# Patient Record
Sex: Male | Born: 1960 | Race: White | Hispanic: No | Marital: Married | Smoking: Former smoker
Health system: Southern US, Community
[De-identification: ages and names within clinical notes are randomized; demographics above are authoritative.]

## PROBLEM LIST (undated history)

## (undated) DIAGNOSIS — J309 Allergic rhinitis, unspecified: Secondary | ICD-10-CM

## (undated) DIAGNOSIS — E785 Hyperlipidemia, unspecified: Secondary | ICD-10-CM

## (undated) DIAGNOSIS — K219 Gastro-esophageal reflux disease without esophagitis: Secondary | ICD-10-CM

## (undated) DIAGNOSIS — H9319 Tinnitus, unspecified ear: Secondary | ICD-10-CM

## (undated) DIAGNOSIS — I10 Essential (primary) hypertension: Secondary | ICD-10-CM

## (undated) DIAGNOSIS — F419 Anxiety disorder, unspecified: Secondary | ICD-10-CM

## (undated) HISTORY — PX: TONSILLECTOMY: SUR1361

## (undated) HISTORY — PX: FUNCTIONAL ENDOSCOPIC SINUS SURGERY: SUR616

---

## 2010-02-23 ENCOUNTER — Ambulatory Visit: Payer: Self-pay | Admitting: Gastroenterology

## 2013-01-20 ENCOUNTER — Emergency Department: Payer: Self-pay | Admitting: Emergency Medicine

## 2013-10-08 ENCOUNTER — Ambulatory Visit: Payer: Self-pay | Admitting: Family Medicine

## 2015-07-13 ENCOUNTER — Telehealth: Payer: Self-pay

## 2015-07-13 NOTE — Telephone Encounter (Signed)
  Oncology Nurse Navigator Documentation  Navigator Location: [facility] (07/13/15 1100) Navigator Encounter Type: Telephone (07/13/15 1100)               Barriers/Navigation Needs: Coordination of Care (07/13/15 1100)   Interventions: Coordination of Care (07/13/15 1100)   Coordination of Care: EUS (07/13/15 1100)                  Time Spent with Patient: 30 (07/13/15 1100)   Received referral for EUS for gastric antrum submucosal nodule. Scheduled for 08/06/15 with Dr Kelly Spl[facility]Feiler [facility]. Voicemail left for patient to return call regarding.

## 2015-07-13 NOTE — Telephone Encounter (Signed)
  Oncology Nurse Navigator Documentation  Navigator Location: [facility] (07/13/15 1500) Navigator Encounter Type: Telephone (07/13/15 1500)               Barriers/Navigation Needs: Coordination of Care (07/13/15 1500)   Interventions: Coordination of Care (07/13/15 1500)   Coordination of Care: EUS (07/13/15 1500)                  Time Spent with Patient: 15 (07/13/15 1500)   Spoke with Shawn Blackwell. Eus arranged for 08-06-15. Denies anticoagulants. No history of diabetes. Went over instructions and copy mailed to home address. Contact information included for any further questions or concerns.  INSTRUCTIONS FOR ENDOSCOPIC ULTRASOUND -Your procedure has been scheduled for April 27th with Shawn Blackwell at [facility] -The hospital will contact you to pre-register over the phone. If for any reason you have not received a call within one week prior to your scheduled procedure date, please call [phone]. -To get your scheduled arrival time, please call the Endoscopy unit at  [phone] between 1-3pm on: April 26th     -ON THE DAY OF YOU PROCEDURE:   1. If you are scheduled for a morning procedure, nothing to drink after midnight  -If you are scheduled for an afternoon procedure, you may have clear liquids until 5 hours prior  to the procedure but no carbonated drinks or broth  2. NO FOOD THE DAY OF YOUR PROCEDURE  3. You may take your heart, seizure, blood pressure, Parkinson's or breathing medications at  6am with just enough water to get your pills down  4. Do not take any oral Diabetic medications the morning of your procedure.  5. If you are a diabetic and are using insulin, please notify your prescribing physician of this  procedure as your dose may need to be altered related to not being able to eat or drink.   5. Do not take Vitamins     -On the day of your procedure, come to the [facility] Admitting/Registration desk (First desk on the right) at the scheduled  arrival time. You MUST have someone drive you home from your procedure. You must have a responsible adult with a valid drivers license who is on site throughout your entire procedure and who can stay with you for several hours after your procedure. You may not go home alone in a taxi, shuttle [place] or bus, as the drivers will not be responsible for you.

## 2015-08-06 ENCOUNTER — Encounter: Payer: Self-pay | Admitting: Anesthesiology

## 2015-08-06 ENCOUNTER — Ambulatory Visit: Payer: BLUE CROSS/BLUE SHIELD | Admitting: Anesthesiology

## 2015-08-06 ENCOUNTER — Ambulatory Visit
Admission: RE | Admit: 2015-08-06 | Discharge: 2015-08-06 | Disposition: A | Discharge status: Home / Self Care | Payer: BLUE CROSS/BLUE SHIELD | Source: Ambulatory Visit | Attending: Gastroenterology | Admitting: Gastroenterology

## 2015-08-06 ENCOUNTER — Encounter
Admission: RE | Disposition: A | Discharge status: Home / Self Care | Payer: Self-pay | Source: Ambulatory Visit | Attending: Gastroenterology

## 2015-08-06 DIAGNOSIS — I10 Essential (primary) hypertension: Secondary | ICD-10-CM | POA: Diagnosis not present

## 2015-08-06 DIAGNOSIS — K319 Disease of stomach and duodenum, unspecified: Secondary | ICD-10-CM | POA: Diagnosis present

## 2015-08-06 DIAGNOSIS — E785 Hyperlipidemia, unspecified: Secondary | ICD-10-CM | POA: Insufficient documentation

## 2015-08-06 DIAGNOSIS — K219 Gastro-esophageal reflux disease without esophagitis: Secondary | ICD-10-CM | POA: Diagnosis not present

## 2015-08-06 DIAGNOSIS — F419 Anxiety disorder, unspecified: Secondary | ICD-10-CM | POA: Insufficient documentation

## 2015-08-06 HISTORY — PX: EUS: SHX5427

## 2015-08-06 HISTORY — DX: Allergic rhinitis, unspecified: J30.9

## 2015-08-06 HISTORY — DX: Anxiety disorder, unspecified: F41.9

## 2015-08-06 HISTORY — DX: Essential (primary) hypertension: I10

## 2015-08-06 HISTORY — DX: Hyperlipidemia, unspecified: E78.5

## 2015-08-06 HISTORY — DX: Tinnitus, unspecified ear: H93.19

## 2015-08-06 HISTORY — DX: Gastro-esophageal reflux disease without esophagitis: K21.9

## 2015-08-06 SURGERY — UPPER ENDOSCOPIC ULTRASOUND (EUS) LINEAR
Anesthesia: General

## 2015-08-06 MED ORDER — PROPOFOL 500 MG/50ML IV EMUL
INTRAVENOUS | Status: DC | PRN
  Administered 2015-08-06: 120 ug/kg/min via INTRAVENOUS

## 2015-08-06 MED ORDER — GLYCOPYRROLATE 0.2 MG/ML IJ SOLN
INTRAMUSCULAR | Status: DC | PRN
  Administered 2015-08-06: 0.1 mg via INTRAVENOUS

## 2015-08-06 MED ORDER — MIDAZOLAM HCL 2 MG/2ML IJ SOLN
INTRAMUSCULAR | Status: DC | PRN
  Administered 2015-08-06: 1 mg via INTRAVENOUS

## 2015-08-06 MED ORDER — LIDOCAINE HCL (CARDIAC) 20 MG/ML IV SOLN
INTRAVENOUS | Status: DC | PRN
  Administered 2015-08-06: 30 mg via INTRAVENOUS

## 2015-08-06 MED ORDER — SODIUM CHLORIDE 0.9 % IV SOLN
INTRAVENOUS | Status: DC
  Administered 2015-08-06: 1000 mL via INTRAVENOUS

## 2015-08-06 MED ORDER — FENTANYL CITRATE (PF) 100 MCG/2ML IJ SOLN
INTRAMUSCULAR | Status: DC | PRN
  Administered 2015-08-06: 50 ug via INTRAVENOUS

## 2015-08-06 NOTE — Anesthesia Preprocedure Evaluation (Signed)
Anesthesia Evaluation  Patient identified by MRN, date of birth, ID band Patient awake    Reviewed: Allergy & Precautions, H&P , NPO status , Patient's Chart, lab work & pertinent test results, reviewed documented beta blocker date and time   History of Anesthesia Complications Negative for: history of anesthetic complications  Airway Mallampati: I  TM Distance: >3 FB Neck ROM: full    Dental no notable dental hx. (+) Implants, Teeth Intact   Pulmonary neg pulmonary ROS,    Pulmonary exam normal breath sounds clear to auscultation       Cardiovascular Exercise Tolerance: Good hypertension, (-) angina(-) CAD, (-) Past MI, (-) Cardiac Stents and (-) CABG Normal cardiovascular exam(-) dysrhythmias (-) Valvular Problems/Murmurs Rhythm:regular Rate:Normal     Neuro/Psych negative neurological ROS  negative psych ROS   GI/Hepatic Neg liver ROS, GERD  ,  Endo/Other  negative endocrine ROS  Renal/GU negative Renal ROS  negative genitourinary   Musculoskeletal   Abdominal   Peds  Hematology negative hematology ROS (+)   Anesthesia Other Findings Past Medical History:   Anxiety                                                      Hypertension                                                 GERD (gastroesophageal reflux disease)                       Tinnitus                                                     Allergic rhinitis                                            Hyperlipemia                                                 Reproductive/Obstetrics negative OB ROS                             Anesthesia Physical Anesthesia Plan  ASA: II  Anesthesia Plan: General   Post-op Pain Management:    Induction:   Airway Management Planned:   Additional Equipment:   Intra-op Plan:   Post-operative Plan:   Informed Consent: I have reviewed the patients History and Physical, chart, labs  and discussed the procedure including the risks, benefits and alternatives for the proposed anesthesia with the patient or authorized representative who has indicated his/her understanding and acceptance.   Dental Advisory Given  Plan Discussed with: Anesthesiologist, CRNA and Surgeon  Anesthesia Plan Comments:         Anesthesia Quick  Evaluation

## 2015-08-06 NOTE — Op Note (Signed)
[facility] Gastroenterology Patient Name: Shawn Blackwell Procedure Date: 08/06/2015 2:00 PM MRN: [id number] Account #: [id number] Date of Birth: Dec 06, 1960 Admit Type: Outpatient Age: 55 Room: [facility] ENDO ROOM 3 Gender: Male Note Status: Finalized Procedure:            Upper EUS Indications:          Gastric deformity on endoscopy/Subepithelial tumor                        versus extrinsic compression Providers:            Nolon Bussing. Kelly Splinter, MD Referring MD:         Marina Goodell (Referring MD), Christena Deem,                        MD (Referring MD) Medicines:            Monitored Anesthesia Care Complications:        No immediate complications. Procedure:            Pre-Anesthesia Assessment:                       - Prior to the procedure, a History and Physical was                        performed, and patient medications, allergies and                        sensitivities were reviewed. The patient's tolerance of                        previous anesthesia was reviewed.                       After obtaining informed consent, the endoscope was                        passed under direct vision. Throughout the procedure,                        the patient's blood pressure, pulse, and oxygen                        saturations were monitored continuously. The upper                        endoscope and subsequently EUS GI Linear Array [id number]                        was introduced through the mouth, and advanced to the                        second part of duodenum. The upper EUS was accomplished                        without difficulty. The patient tolerated the procedure                        well. Findings:      Endoscopic Finding :      The examined esophagus was endoscopically normal.  A small, submucosal, non-circumferential mass with no bleeding and no       stigmata of recent bleeding was found in the gastric antrum.      A few, diminutive  non-bleeding erosions were found in the gastric antrum.      The examined duodenum was endoscopically normal.      Endosonographic Finding :      An oval intramural (subepithelial) lesion was found in the antrum of the       stomach. The lesion was hypoechoic. Sonographically, the lesion appeared       to originate from the submucosa (Layer 3). The lesion measured 7.3 mm by       3.7 mm in maximal cross sectional diameter. The outer endosonographic       borders were well defined. 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 An intact interface was seen between       the mass and the adjacent structures suggesting a lack of invasion.      Endosonographic imaging in the visualized portion of the liver showed no       mass.      No abnormal-appearing lymph nodes were seen during endosonographic       examination in the celiac region (level 20). Impression:           - An intramural (subepithelial) lesion was found in the                        antrum of the stomach. The lesion appeared to originate                        from within the submucosa (Layer 3). Tissue has not                        been obtained. 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 Too small for FNA and ectopic                        pancreatic tissue is unlikely to yield a diagnosis via                        FNA in any event.                       - Non-bleeding erosive gastropathy.                       - No specimens collected. Recommendation:       - Repeat the upper endoscopic ultrasound in 1 year for                        surveillance.                       - Patient has a contact number available for  emergencies. The signs and symptoms of potential                        delayed complications were discussed with the patient.                         Return to normal activities tomorrow. Written discharge                        instructions were provided to the patient.                       - Return to referring physician. Procedure Code(s):    --- Professional ---                       [id number], Esophagogastroduodenoscopy, flexible, transoral;                        with endoscopic ultrasound examination limited to the                        esophagus, stomach or duodenum, and adjacent structures Diagnosis Code(s):    --- Professional ---                       D49.0, Neoplasm of unspecified behavior of digestive                        system                       K31.89, Other diseases of stomach and duodenum CPT copyright 2016 American Medical Association. All rights reserved. The codes documented in this report are preliminary and upon coder review may  be revised to meet current compliance requirements. Attending Participation:      I personally performed the entire procedure without the assistance of a       fellow, resident or surgical assistant. Leodis Sias, MD 08/06/2015 2:36:18 PM This report has been signed electronically. Number of Addenda: 0 Note Initiated On: 08/06/2015 2:00 PM Estimated Blood Loss: Estimated blood loss: none.      [facility]

## 2015-08-06 NOTE — Anesthesia Postprocedure Evaluation (Signed)
Anesthesia Post Note  Patient: Shawn FentonJohn R Blackwell  Procedure(s) Performed: Procedure(s) (LRB): UPPER ENDOSCOPIC ULTRASOUND (EUS) LINEAR (N/A)  Patient location during evaluation: Endoscopy Anesthesia Type: General Level of consciousness: awake and alert Pain management: pain level controlled Vital Signs Assessment: post-procedure vital signs reviewed and stable Respiratory status: spontaneous breathing, nonlabored ventilation, respiratory function stable and patient connected to nasal cannula oxygen Cardiovascular status: blood pressure returned to baseline and stable Postop Assessment: no signs of nausea or vomiting Anesthetic complications: no    Last Vitals:  Filed Vitals:   08/06/15 1442 08/06/15 1452  BP: 113/78 118/84  Pulse: 64 64  Temp:    Resp: 11 12    Last Pain: There were no vitals filed for this visit.               Devonne Kitchen S

## 2015-08-06 NOTE — Anesthesia Procedure Notes (Signed)
Performed by: COOK-MARTIN, Ohanna Gassert Pre-anesthesia Checklist: Patient identified, Emergency Drugs available, Suction available, Patient being monitored and Timeout performed Patient Re-evaluated:Patient Re-evaluated prior to inductionOxygen Delivery Method: Nasal cannula Preoxygenation: Pre-oxygenation with 100% oxygen Intubation Type: IV induction Airway Equipment and Method: Bite block Placement Confirmation: CO2 detector and positive ETCO2     

## 2015-08-06 NOTE — Transfer of Care (Signed)
Immediate Anesthesia Transfer of Care Note  Patient: Shawn FentonJohn R Dombkowski  Procedure(s) Performed: Procedure(s): UPPER ENDOSCOPIC ULTRASOUND (EUS) LINEAR (N/A)  Patient Location: PACU  Anesthesia Type:General  Level of Consciousness: awake and sedated  Airway & Oxygen Therapy: Patient Spontanous Breathing and Patient connected to nasal cannula oxygen  Post-op Assessment: Report given to RN and Post -op Vital signs reviewed and stable  Post vital signs: Reviewed and stable  Last Vitals:  Filed Vitals:   08/06/15 1336  BP: 155/98  Pulse: 64  Temp: 36.3 C  Resp: 20    Last Pain: There were no vitals filed for this visit.       Complications: No apparent anesthesia complications

## 2015-08-07 ENCOUNTER — Encounter: Payer: Self-pay | Admitting: Gastroenterology

## 2015-08-10 NOTE — Progress Notes (Signed)
  Oncology Nurse Navigator Documentation  Navigator Location: [facility] (08/10/15 1000) Navigator Encounter Type: Diagnostic Results (08/10/15 1000)               Barriers/Navigation Needs: Coordination of Care (08/10/15 1000)   Interventions: Coordination of Care (08/10/15 1000)   Coordination of Care: EUS (08/10/15 1000)                  Time Spent with Patient: 15 (08/10/15 1000)   EUS procedure results routed to Dr Marva PandaSkulskie

## 2015-09-01 NOTE — Progress Notes (Signed)
  Oncology Nurse Navigator Documentation  Navigator Location: [facility] (09/01/15 1000) Navigator Encounter Type: Letter/Fax/Email (09/01/15 1000)                                          Time Spent with Patient: 15 (09/01/15 1000)   H&P received. Faxed to [employer] in [place].

## 2016-08-18 ENCOUNTER — Telehealth: Payer: Self-pay

## 2016-08-18 NOTE — Telephone Encounter (Signed)
  Oncology Nurse Navigator Documentation Received referral for EUS evaluation of gastric nodule. Voicemail left with Mr. Lady GaryCannon to return call for scheduling. Navigator Location: [facility] (08/18/16 1400)   )Navigator Encounter Type: Telephone (08/18/16 1400) Telephone: Outgoing Call (08/18/16 1400)                       Barriers/Navigation Needs: Coordination of Care (08/18/16 1400)   Interventions: Coordination of Care (08/18/16 1400)   Coordination of Care: EUS (08/18/16 1400)                  Time Spent with Patient: 15 (08/18/16 1400)

## 2016-08-22 ENCOUNTER — Telehealth: Payer: Self-pay

## 2016-08-22 NOTE — Telephone Encounter (Signed)
  Oncology Nurse Navigator Documentation Voicemail left with Mr. Shawn Blackwell to return call for EUS scheduling. Navigator Location: [facility] (08/22/16 1000)   )Navigator Encounter Type: Telephone (08/22/16 1000) Telephone: Outgoing Call (08/22/16 1000)                       Barriers/Navigation Needs: Coordination of Care (08/22/16 1000)   Interventions: Coordination of Care (08/22/16 1000)   Coordination of Care: EUS (08/22/16 1000)                  Time Spent with Patient: 15 (08/22/16 1000)

## 2016-08-25 NOTE — Progress Notes (Signed)
  Oncology Nurse Navigator Documentation Notified Shawn [facility]well at [facility] that Mr. Shawn Blackwell has not responded to voice mails left for scheduling EUS Navigator Location: [facility] (08/25/16 1600)   )Navigator Encounter Type: Other (08/25/16 1600)                                 Coordination of Care: EUS (08/25/16 1600)                  Time Spent with Patient: 15 (08/25/16 1600)

## 2016-08-26 ENCOUNTER — Telehealth: Payer: Self-pay

## 2016-08-26 NOTE — Telephone Encounter (Signed)
  Oncology Nurse Navigator Documentation Mr. Shawn Blackwell returned call and his EUS was scheduled for 09/08/16 at [place] with Dr. Chevis PrettySpaete. Went over instructions and copy mailed to home address. Denies diabetes or taking anticoagulants.  INSTRUCTIONS FOR ENDOSCOPIC ULTRASOUND -Your procedure has been scheduled for May 30th with Dr. Chevis [facility]Spaete at [facility]. -The hospital may contact you to pre-register over the phone.  -To get your scheduled arrival time, please call the Endoscopy unit at  [phone] between 1-3 p.m. on:  May 29th   -ON THE DAY OF YOU PROCEDURE:   1. If you are scheduled for a morning procedure, nothing to drink after midnight  -If you are scheduled for an afternoon procedure, you may have clear liquids until 5 hours prior  to the procedure but no carbonated drinks or broth  2. NO FOOD THE DAY OF YOUR PROCEDURE  3. You may take your heart, seizure, blood pressure, Parkinson's or breathing medications at  6am with just enough water to get your pills down  4. Do not take any oral Diabetic medications the morning of your procedure.  5. If you are a diabetic and are using insulin, please notify your prescribing physician of this  procedure as your dose may need to be altered related to not being able to eat or drink.   5. Do not take vitamins, iron, or fish oil for 5 days before your procedure     -On the day of your procedure, come to the [facility] Mall Admitting/Registration desk (First desk on the right) at the scheduled arrival time. You MUST have someone drive you home from your procedure. You must have a responsible adult with a valid driver's license who is on site throughout your entire procedure and who can stay with you for several hours after your procedure. You may not go home alone in a taxi, shuttle [place] or bus, as the drivers will not be responsible for you.  --If you have any questions please call me at the above contact    Navigator Location:  [facility] (08/26/16 1300)   )Navigator Encounter Type: Telephone (08/26/16 1300)                                 Coordination of Care: EUS (08/26/16 1300)                  Time Spent with Patient: 30 (08/26/16 1300)

## 2016-11-03 ENCOUNTER — Ambulatory Visit
Admission: RE | Admit: 2016-11-03 | Discharge: 2016-11-03 | Disposition: A | Discharge status: Home / Self Care | Payer: BLUE CROSS/BLUE SHIELD | Source: Ambulatory Visit | Attending: Internal Medicine | Admitting: Internal Medicine

## 2016-11-03 ENCOUNTER — Ambulatory Visit: Payer: BLUE CROSS/BLUE SHIELD | Admitting: Certified Registered"

## 2016-11-03 ENCOUNTER — Encounter: Payer: Self-pay | Admitting: *Deleted

## 2016-11-03 ENCOUNTER — Encounter
Admission: RE | Disposition: A | Discharge status: Home / Self Care | Payer: Self-pay | Source: Ambulatory Visit | Attending: Internal Medicine

## 2016-11-03 DIAGNOSIS — Z888 Allergy status to other drugs, medicaments and biological substances status: Secondary | ICD-10-CM | POA: Diagnosis not present

## 2016-11-03 DIAGNOSIS — F419 Anxiety disorder, unspecified: Secondary | ICD-10-CM | POA: Insufficient documentation

## 2016-11-03 DIAGNOSIS — Z87891 Personal history of nicotine dependence: Secondary | ICD-10-CM | POA: Insufficient documentation

## 2016-11-03 DIAGNOSIS — E785 Hyperlipidemia, unspecified: Secondary | ICD-10-CM | POA: Insufficient documentation

## 2016-11-03 DIAGNOSIS — I10 Essential (primary) hypertension: Secondary | ICD-10-CM | POA: Diagnosis not present

## 2016-11-03 DIAGNOSIS — Z79899 Other long term (current) drug therapy: Secondary | ICD-10-CM | POA: Diagnosis not present

## 2016-11-03 DIAGNOSIS — K3189 Other diseases of stomach and duodenum: Secondary | ICD-10-CM | POA: Diagnosis not present

## 2016-11-03 DIAGNOSIS — K219 Gastro-esophageal reflux disease without esophagitis: Secondary | ICD-10-CM | POA: Insufficient documentation

## 2016-11-03 DIAGNOSIS — Z9889 Other specified postprocedural states: Secondary | ICD-10-CM | POA: Diagnosis not present

## 2016-11-03 DIAGNOSIS — J309 Allergic rhinitis, unspecified: Secondary | ICD-10-CM | POA: Diagnosis not present

## 2016-11-03 HISTORY — PX: UPPER ESOPHAGEAL ENDOSCOPIC ULTRASOUND (EUS): SHX6562

## 2016-11-03 SURGERY — UPPER ESOPHAGEAL ENDOSCOPIC ULTRASOUND (EUS)
Anesthesia: General

## 2016-11-03 MED ORDER — PROPOFOL 500 MG/50ML IV EMUL
INTRAVENOUS | Status: DC | PRN
  Administered 2016-11-03: 150 ug/kg/min via INTRAVENOUS

## 2016-11-03 MED ORDER — LIDOCAINE HCL (PF) 2 % IJ SOLN
INTRAMUSCULAR | Status: AC
  Filled 2016-11-03: qty 2

## 2016-11-03 MED ORDER — MIDAZOLAM HCL 2 MG/2ML IJ SOLN
INTRAMUSCULAR | Status: AC
  Filled 2016-11-03: qty 2

## 2016-11-03 MED ORDER — GLYCOPYRROLATE 0.2 MG/ML IJ SOLN
INTRAMUSCULAR | Status: AC
  Filled 2016-11-03: qty 1

## 2016-11-03 MED ORDER — EPHEDRINE SULFATE 50 MG/ML IJ SOLN
INTRAMUSCULAR | Status: AC
  Filled 2016-11-03: qty 1

## 2016-11-03 MED ORDER — PROPOFOL 500 MG/50ML IV EMUL
INTRAVENOUS | Status: AC
  Filled 2016-11-03: qty 50

## 2016-11-03 MED ORDER — EPHEDRINE SULFATE 50 MG/ML IJ SOLN
INTRAMUSCULAR | Status: DC | PRN
  Administered 2016-11-03: 15 mg via INTRAVENOUS

## 2016-11-03 MED ORDER — PROPOFOL 10 MG/ML IV BOLUS
INTRAVENOUS | Status: DC | PRN
  Administered 2016-11-03: 20 mg via INTRAVENOUS
  Administered 2016-11-03: 60 mg via INTRAVENOUS

## 2016-11-03 MED ORDER — LIDOCAINE HCL (CARDIAC) 20 MG/ML IV SOLN
INTRAVENOUS | Status: DC | PRN
  Administered 2016-11-03: 40 mg via INTRAVENOUS

## 2016-11-03 MED ORDER — SODIUM CHLORIDE 0.9 % IV SOLN
INTRAVENOUS | Status: DC
  Administered 2016-11-03: 11:00:00 via INTRAVENOUS

## 2016-11-03 MED ORDER — GLYCOPYRROLATE 0.2 MG/ML IJ SOLN
INTRAMUSCULAR | Status: DC | PRN
  Administered 2016-11-03: 0.2 mg via INTRAVENOUS

## 2016-11-03 NOTE — Transfer of Care (Signed)
Immediate Anesthesia Transfer of Care Note  Patient: Shawn FentonJohn R Blackwell  Procedure(s) Performed: Procedure(s): UPPER ESOPHAGEAL ENDOSCOPIC ULTRASOUND (EUS) (N/A)  Patient Location: PACU and Endoscopy Unit  Anesthesia Type:General  Level of Consciousness: sedated  Airway & Oxygen Therapy: Patient Spontanous Breathing and Patient connected to nasal cannula oxygen  Post-op Assessment: Report given to RN and Post -op Vital signs reviewed and stable  Post vital signs: Reviewed and stable  Last Vitals:  Vitals:   11/03/16 1059 11/03/16 1205  BP: (!) 154/99 (!) 93/57  Pulse: 62 69  Resp: 18 (!) 25  Temp: (!) 36.4 C (!) 36.3 C    Complications: No apparent anesthesia complications

## 2016-11-03 NOTE — H&P (Signed)
Kevin FentonJohn R Cryder is an 56 y.o. male.   Chief Complaint: Submucosal gastric nodule HPI: Here for annual surveillance of a gastric nodule.  No changes in medical history over the last year.  Past Medical History:  Diagnosis Date  . Allergic rhinitis   . Anxiety   . GERD (gastroesophageal reflux disease)   . Hyperlipemia   . Hypertension   . Tinnitus     Past Surgical History:  Procedure Laterality Date  . EUS N/A 08/06/2015   Procedure: UPPER ENDOSCOPIC ULTRASOUND (EUS) LINEAR;  Surgeon: Wayland SalinasMichael Justin Feiler, MD;  Location[facility] ENDOSCOPY;  Service: Endoscopy;  Laterality: N/A;  . FUNCTIONAL ENDOSCOPIC SINUS SURGERY    . TONSILLECTOMY      History reviewed. No pertinent family history. Social History:  reports that he has quit smoking. He has never used smokeless tobacco. He reports that he does not drink alcohol or use drugs.  Allergies:  Allergies  Allergen Reactions  . Chlorpheniramine-Phenylephrine     Medications Prior to Admission  Medication Sig Dispense Refill  . citalopram (CELEXA) 10 MG tablet Take 10 mg by mouth daily.    . fenofibrate (TRICOR) 145 MG tablet Take 145 mg by mouth daily.    . fexofenadine (ALLEGRA) 180 MG tablet Take 180 mg by mouth daily.    . hydrochlorothiazide (HYDRODIURIL) 25 MG tablet Take 25 mg by mouth daily.    . metoprolol tartrate (LOPRESSOR) 25 MG tablet Take 25 mg by mouth 2 (two) times daily.    Marland Kitchen. omeprazole (PRILOSEC) 20 MG capsule Take 20 mg by mouth daily.    . traMADol (ULTRAM) 50 MG tablet Take 50 mg by mouth every 6 (six) hours as needed.    Marland Kitchen. NIACIN PO Take by mouth.      No results found for this or any previous visit (from the past 48 hour(s)). No results found.  ROS  Blood pressure (!) 154/99, pulse 62, temperature (!) 97.5 F (36.4 C), temperature source Tympanic, resp. rate 18, height 5\' 7"  (1.702 m), weight 89.4 kg (197 lb), SpO2 98 %. Physical Exam  Gen:  No apparent distress Respiratory: lungs clear to aucsulation  bilaterally Cardiovascular:  RRR, no M/R/G Abdomen: Soft, NT, ND    Assessment/Plan 1. Gastric submucosal nodule: Plan for upper EUS today.  Mike GipBurbridge,  Krisna Omar, MD 11/03/2016, 11:12 AM

## 2016-11-03 NOTE — Anesthesia Preprocedure Evaluation (Signed)
Anesthesia Evaluation  Patient identified by MRN, date of birth, ID band Patient awake    Reviewed: Allergy & Precautions, H&P , NPO status , Patient's Chart, lab work & pertinent test results, reviewed documented beta blocker date and time   History of Anesthesia Complications Negative for: history of anesthetic complications  Airway Mallampati: II  TM Distance: <3 FB Neck ROM: limited    Dental no notable dental hx. (+) Implants, Chipped, Poor Dentition   Pulmonary neg shortness of breath, former smoker,           Cardiovascular Exercise Tolerance: Good hypertension, (-) angina(-) CAD, (-) Past MI, (-) Cardiac Stents and (-) CABG (-) dysrhythmias (-) Valvular Problems/Murmurs     Neuro/Psych PSYCHIATRIC DISORDERS Anxiety negative neurological ROS     GI/Hepatic Neg liver ROS, GERD  Controlled and Medicated,  Endo/Other  negative endocrine ROS  Renal/GU negative Renal ROS  negative genitourinary   Musculoskeletal   Abdominal   Peds  Hematology negative hematology ROS (+)   Anesthesia Other Findings Signs and symptoms suggestive of sleep apnea   Past Medical History: No date: Allergic rhinitis No date: Anxiety No date: GERD (gastroesophageal reflux disease) No date: Hyperlipemia No date: Hypertension No date: Tinnitus  BMI    Body Mass Index:  30.85 kg/m                                       Reproductive/Obstetrics negative OB ROS                             Anesthesia Physical  Anesthesia Plan  ASA: III  Anesthesia Plan: General   Post-op Pain Management:    Induction: Intravenous  PONV Risk Score and Plan:   Airway Management Planned: Natural Airway and Nasal Cannula  Additional Equipment:   Intra-op Plan:   Post-operative Plan:   Informed Consent: I have reviewed the patients History and Physical, chart, labs and discussed the procedure including the risks,  benefits and alternatives for the proposed anesthesia with the patient or authorized representative who has indicated his/her understanding and acceptance.   Dental Advisory Given  Plan Discussed with: Anesthesiologist, CRNA and Surgeon  Anesthesia Plan Comments: (Patient consented for risks of anesthesia including but not limited to:  - adverse reactions to medications - risk of intubation if required - damage to teeth, lips or other oral mucosa - sore throat or hoarseness - Damage to heart, brain, lungs or loss of life  Patient voiced understanding.)        Anesthesia Quick Evaluation

## 2016-11-03 NOTE — Op Note (Signed)
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 Providers:            Murray Hodgkins. [place] Referring MD:         Lollie Sails, MD (Referring MD) Medicines:            Propofol per Anesthesia Complications:        No immediate complications. Procedure:            Pre-Anesthesia Assessment:                       Prior to the procedure, a History and Physical was                        performed, and patient medications and allergies were                        reviewed. The patient is competent. The risks and                        benefits of the procedure and the sedation options and                        risks were discussed with the patient. All questions                        were answered and informed consent was obtained.                        Patient identification and proposed procedure were                        verified by the physician, the nurse and the technician                        in the pre-procedure area. Mental Status Examination:                        alert and oriented. Airway Examination: normal                        oropharyngeal airway and neck mobility. Respiratory                        Examination: clear to  auscultation. CV Examination:                        normal. Prophylactic Antibiotics: The patient does not                        require prophylactic antibiotics. Prior Anticoagulants:                         The patient has taken no previous anticoagulant or                        antiplatelet agents. ASA Grade Assessment: II - A                        patient with mild systemic disease. After reviewing the                        risks and benefits, the patient was deemed in                        satisfactory condition to undergo the procedure. The                        anesthesia plan was to use monitored anesthesia care                        (MAC). Immediately prior to administration of                        medications, the patient was re-assessed for adequacy                        to receive sedatives. The heart rate, respiratory rate,                        oxygen saturations, blood pressure, adequacy of                        pulmonary ventilation, and response to care were                        monitored throughout the procedure. The physical status                        of the patient was re-assessed after the procedure.                       After obtaining informed consent, the endoscope was                        passed under direct vision. Throughout the procedure,                        the patient's blood pressure, pulse, and oxygen                        saturations were monitored continuously.The upper EUS                        was accomplished  without difficulty. The patient                        tolerated the procedure well. The EUS GI Radial Array                        [id number] was introduced through the mouth, and advanced                        to the stomach for ultrasound examination from the                        esophagus and stomach. The Colonoscope was introduced                        through the mouth, and advanced to the second part of                        duodenum. Findings:      Endoscopic Finding :      The examined esophagus was endoscopically normal.      A single small submucosal papule (nodule) was found on the posterior        wall of the gastric antrum.      The examined duodenum was endoscopically normal.      Endosonographic Finding :      An oval intramural (subepithelial) lesion was found in the antrum of the       stomach. The lesion was hypoechoic. Sonographically, the lesion appeared       to originate from the submucosa (Layer 3). The lesion measured 6.4 mm by       3.1 mm in diameter. The outer endosonographic borders were well defined.      Endosonographic imaging in the left lobe of the liver showed no       abnormalities.      No lymphadenopathy seen.      The celiac region was visualized and showed no sign of significant       endosonographic abnormality. Impression:           EGD Impressions:                       - Normal esophagus.                       - A single submucosal nodule was found in the stomach.                       - Normal examined duodenum.                       EUS Impressions:                       - An intramural (subepithelial) lesion was found in the                        antrum of the stomach. The lesion appeared to originate                        from within the submucosa (Layer 3). Consistent in  appearance with a benign pancreas rest. Stable in                        characteristics when compared to EUS exam 1 year ago.                       - Normal visualized portions of the liver.                       - No lymphadenopathy seen.                       - Normal celiac region.                       - No specimens collected. Recommendation:       - Discharge patient to home (ambulatory).                       - No further EUS surveillance procedures necessary                        given stability of size and stable characteristics on                        subsequent EUS exams.                       - The findings and recommendations were discussed with                        the patient and his family.                       - Return  to referring physician as previously scheduled. Procedure Code(s):    --- Professional ---                       [phone], Esophagogastroduodenoscopy, flexible, transoral;                        with endoscopic ultrasound examination limited to the                        esophagus, stomach or duodenum, and adjacent structures Diagnosis Code(s):    --- Professional ---                       K31.89, Other diseases of stomach and duodenum CPT copyright 2016 [employer]. All rights reserved. The codes documented in this report are preliminary and upon coder review may  be revised to meet current compliance requirements. Attending Participation:      I personally performed the entire procedure without the assistance of a       fellow, resident or surgical assistant. [place],  11/03/2016 12:08:06 PM This report has been signed electronically. Number of Addenda: 0 Note Initiated On: 11/03/2016 11:25 AM Estimated Blood Loss: Estimated blood loss: none.      [facility]

## 2016-11-03 NOTE — Anesthesia Postprocedure Evaluation (Signed)
Anesthesia Post Note  Patient: Kevin FentonJohn R Schirm  Procedure(s) Performed: Procedure(s) (LRB): UPPER ESOPHAGEAL ENDOSCOPIC ULTRASOUND (EUS) (N/A)  Patient location during evaluation: PACU Anesthesia Type: General Level of consciousness: awake Pain management: pain level controlled Vital Signs Assessment: post-procedure vital signs reviewed and stable Respiratory status: spontaneous breathing Cardiovascular status: stable Anesthetic complications: no     Last Vitals:  Vitals:   11/03/16 1205 11/03/16 1215  BP: (!) 93/57 102/62  Pulse: 69 63  Resp: (!) 25 13  Temp: (!) 36.3 C     Last Pain:  Vitals:   11/03/16 1215  TempSrc:   PainSc: Asleep                 VAN STAVEREN,Trysten Berti

## 2016-11-03 NOTE — Anesthesia Post-op Follow-up Note (Cosign Needed)
Anesthesia QCDR form completed.        

## 2016-11-04 ENCOUNTER — Encounter: Payer: Self-pay | Admitting: Internal Medicine

## 2016-11-08 NOTE — Progress Notes (Signed)
  Oncology Nurse Navigator Documentation EUS results routed to Dr. Marva PandaSkulskie and Vevelyn Pathristiane London Np at [facility]. Navigator Location: [facility] (11/08/16 1100)   )Navigator Encounter Type: Letter/Fax/Email (11/08/16 1100)                                                    Time Spent with Patient: 15 (11/08/16 1100)

## 2020-11-25 ENCOUNTER — Other Ambulatory Visit: Payer: Self-pay | Admitting: Family Medicine

## 2020-11-25 DIAGNOSIS — R1011 Right upper quadrant pain: Secondary | ICD-10-CM

## 2020-12-07 ENCOUNTER — Other Ambulatory Visit: Payer: Self-pay

## 2020-12-07 ENCOUNTER — Ambulatory Visit
Admission: RE | Admit: 2020-12-07 | Discharge: 2020-12-07 | Disposition: A | Discharge status: Home / Self Care | Payer: BC Managed Care – PPO | Source: Ambulatory Visit | Attending: Family Medicine | Admitting: Family Medicine

## 2020-12-07 DIAGNOSIS — R1011 Right upper quadrant pain: Secondary | ICD-10-CM | POA: Diagnosis not present

## 2022-05-03 IMAGING — US US ABDOMEN LIMITED
1 series · 14 of 25 positions shown · non-contrast
Comparison: None.

CLINICAL DATA: Right quadrant pain

EXAM:
ULTRASOUND ABDOMEN LIMITED RIGHT UPPER QUADRANT

[Series 1: us abdomen limited · 0.20mm/px · 14 of 27 slices shown]
[im 1/27]
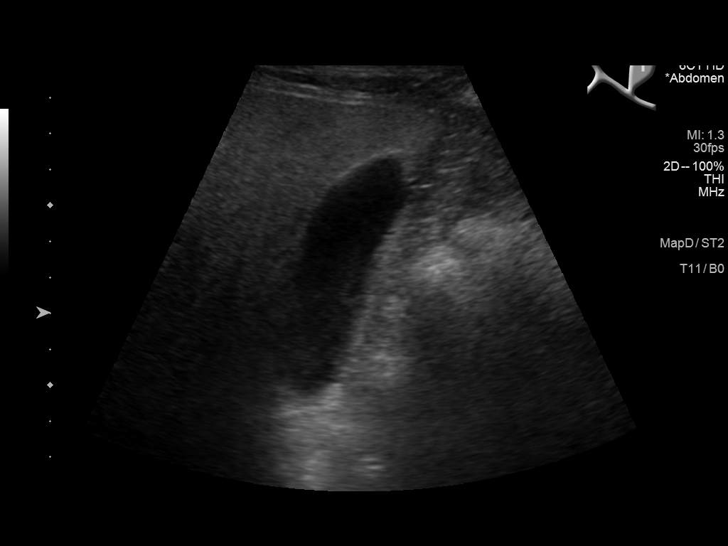
[im 3/27]
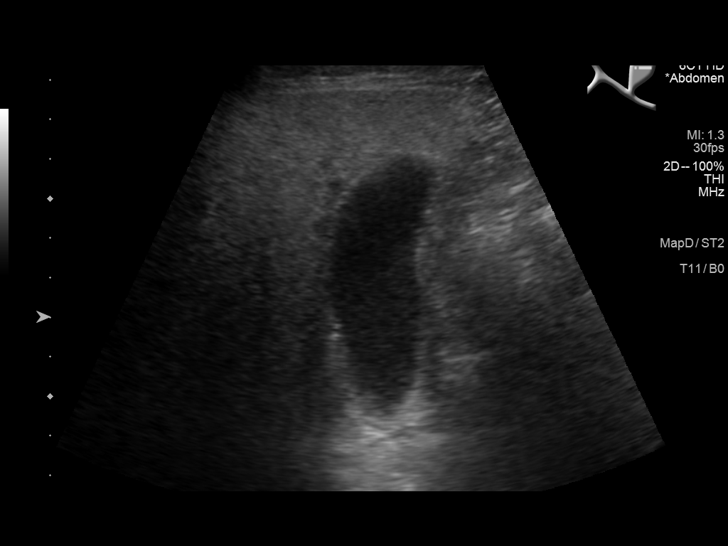
[im 5/27]
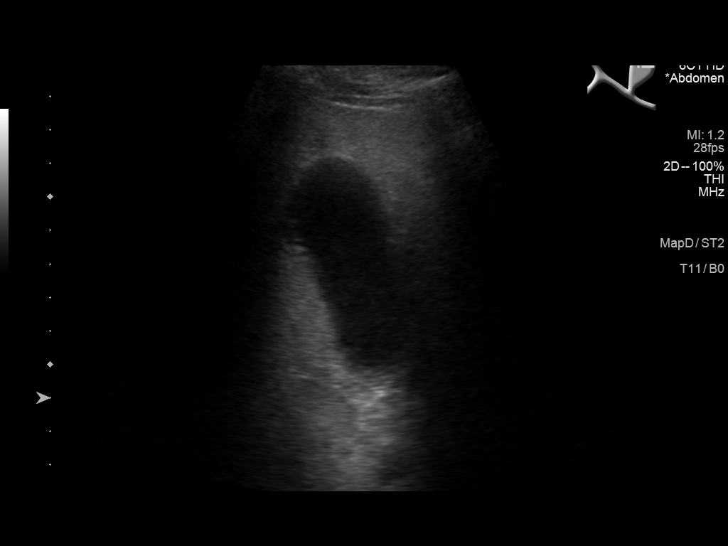
[im 7/27]
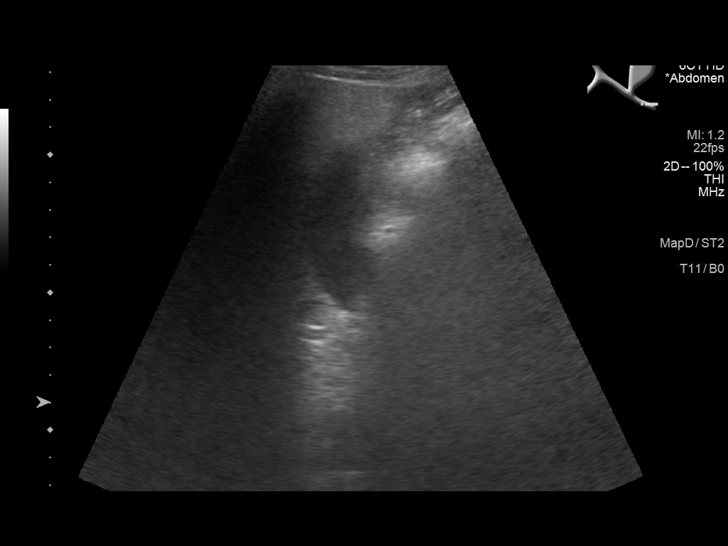
[im 9/27]
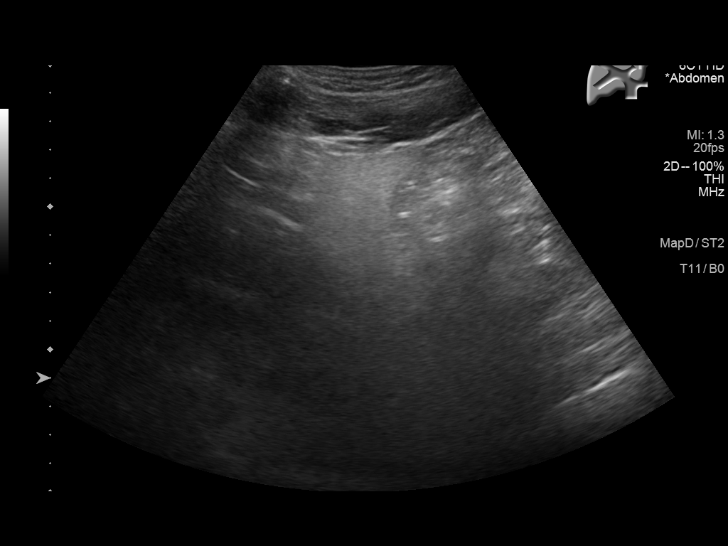
[im 10/27]
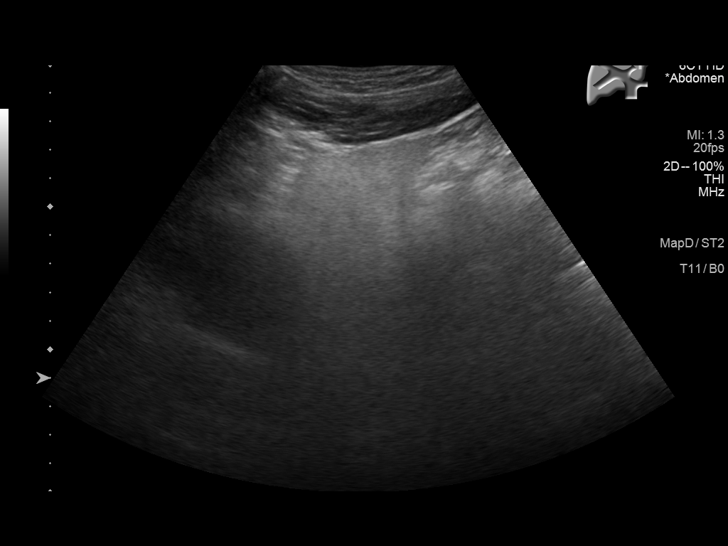
[im 12/27]
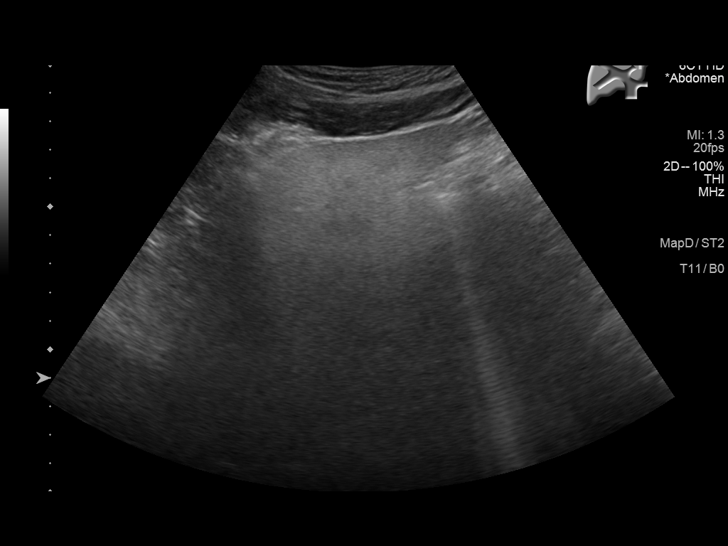
[im 15/27]
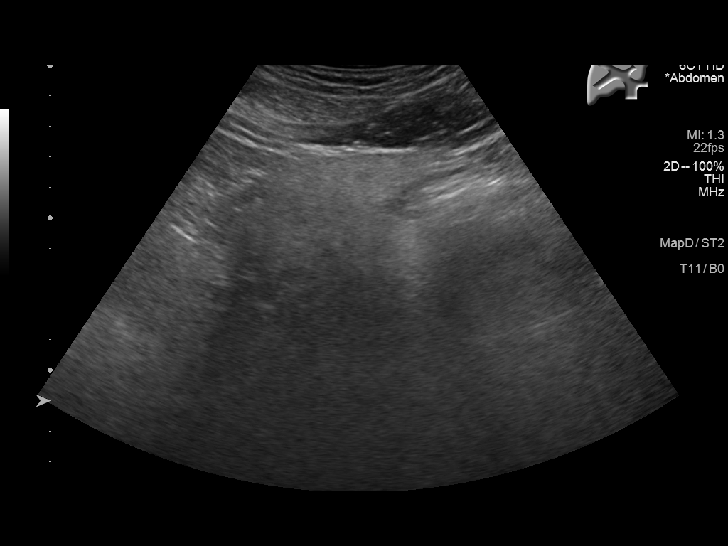
[im 17/27]
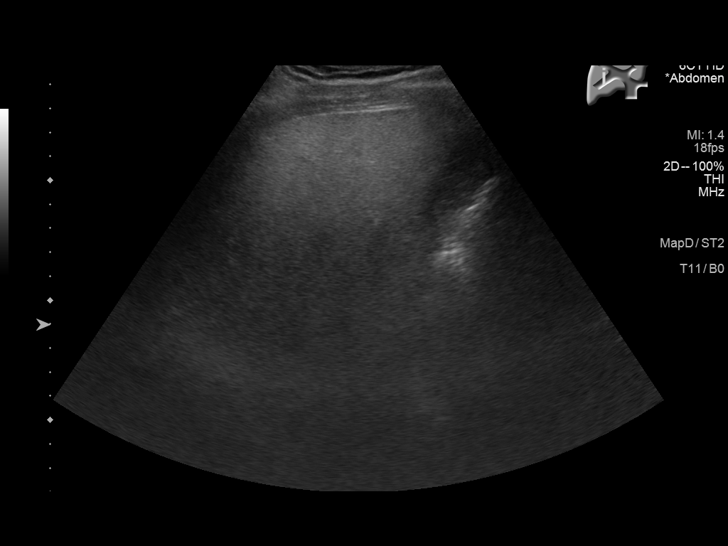
[im 18/27]
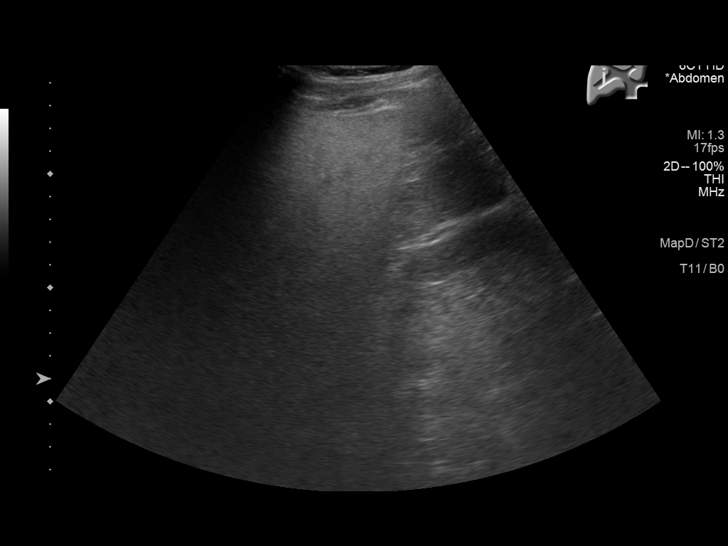
[im 20/27]
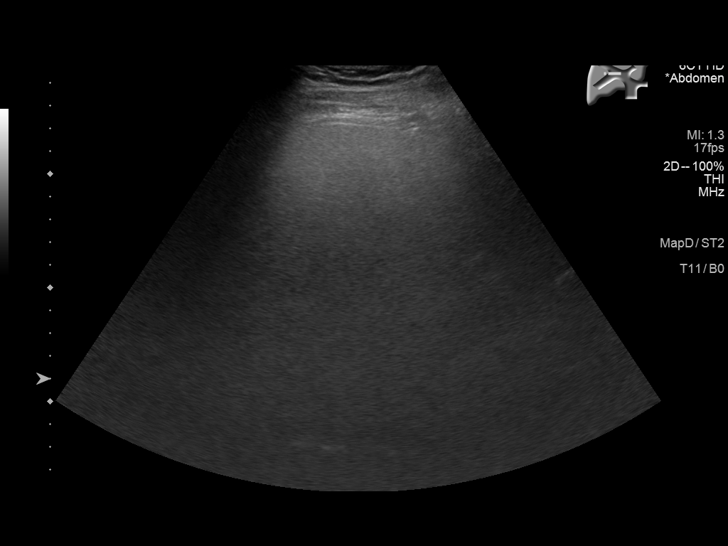
[im 22/27]
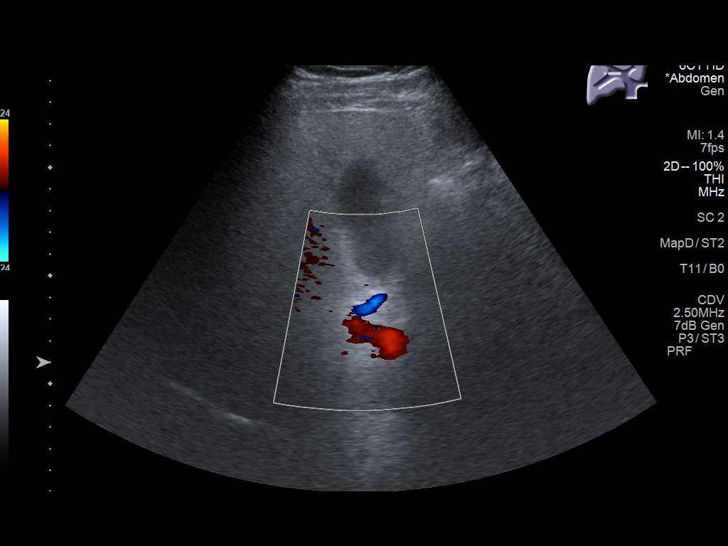
[im 24/27]
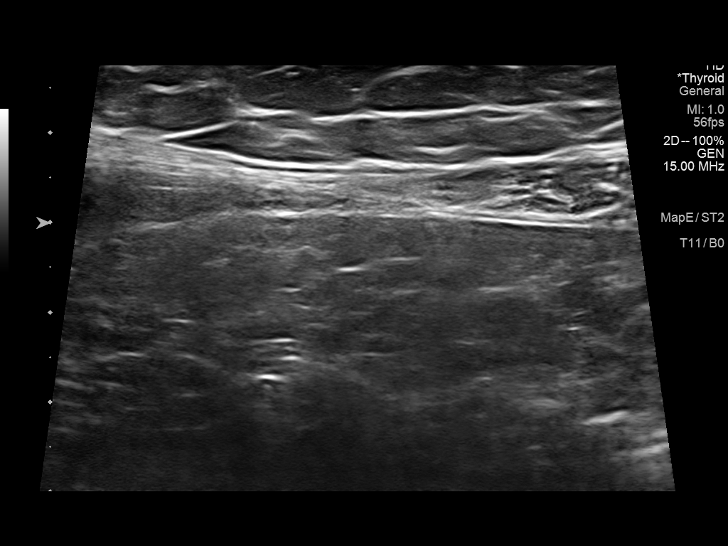
[im 27/27]
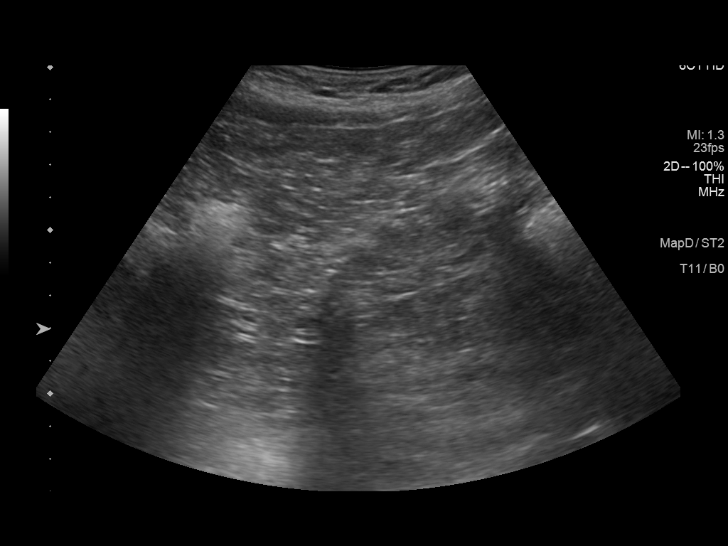

[14 of 25 positions shown; findings below may reference images not displayed]

FINDINGS: Gallbladder:

Hypoechoic intraluminal gallbladder sludge noted. No definite
gallstones. Normal wall thickness measuring 2.9 mm. No Murphy's sign
or pericholecystic fluid. No signs of cholecystitis.

Common bile duct:

Diameter: 4.1 mm

Liver:

Increased hepatic echogenicity. This limits evaluation of the liver.
No large hepatic abnormality or intrahepatic biliary dilatation. No
surrounding free fluid or ascites. Portal vein is patent on color
Doppler imaging with normal direction of blood flow towards the
liver.

Other: Included imaging of the right upper quadrant abdominal wall
area of concern demonstrates no significant abnormality by
ultrasound.
IMPRESSION: Gallbladder sludge.  Negative for gallstones or acute finding.

No biliary dilatation

Hepatic steatosis with limited assessment of the liver.

No free fluid.
